# Patient Record
Sex: Female | Born: 1985 | Race: Black or African American | Hispanic: No | Marital: Single | Smoking: Never smoker
Health system: Southern US, Community
[De-identification: ages and names within clinical notes are randomized; demographics above are authoritative.]

---

## 2017-07-19 ENCOUNTER — Emergency Department (HOSPITAL_COMMUNITY)
Admission: EM | Admit: 2017-07-19 | Discharge: 2017-07-19 | Disposition: A | Discharge status: Home / Self Care | Payer: Medicaid - Out of State | Attending: Emergency Medicine | Admitting: Emergency Medicine

## 2017-07-19 ENCOUNTER — Emergency Department (HOSPITAL_COMMUNITY): Payer: Medicaid - Out of State

## 2017-07-19 ENCOUNTER — Encounter (HOSPITAL_COMMUNITY): Payer: Self-pay | Admitting: Emergency Medicine

## 2017-07-19 DIAGNOSIS — R0602 Shortness of breath: Secondary | ICD-10-CM | POA: Insufficient documentation

## 2017-07-19 DIAGNOSIS — R079 Chest pain, unspecified: Secondary | ICD-10-CM

## 2017-07-19 LAB — BASIC METABOLIC PANEL
Anion gap: 8 (ref 5–15)
BUN: 6 mg/dL (ref 6–20)
CHLORIDE: 104 mmol/L (ref 101–111)
CO2: 25 mmol/L (ref 22–32)
Calcium: 9.2 mg/dL (ref 8.9–10.3)
Creatinine, Ser: 0.78 mg/dL (ref 0.44–1.00)
GFR calc Af Amer: >60 mL/min (ref >60)
GFR calc non Af Amer: >60 mL/min (ref >60)
GLUCOSE: 94 mg/dL (ref 65–99)
POTASSIUM: 3.9 mmol/L (ref 3.5–5.1)
Sodium: 137 mmol/L (ref 135–145)

## 2017-07-19 LAB — CBC
HEMATOCRIT: 39.6 % (ref 36.0–46.0)
Hemoglobin: 13.4 g/dL (ref 12.0–15.0)
MCH: 30.7 pg (ref 26.0–34.0)
MCHC: 33.8 g/dL (ref 30.0–36.0)
MCV: 90.8 fL (ref 78.0–100.0)
Platelets: 348 10*3/uL (ref 150–400)
RBC: 4.36 MIL/uL (ref 3.87–5.11)
RDW: 14.1 % (ref 11.5–15.5)
WBC: 8 10*3/uL (ref 4.0–10.5)

## 2017-07-19 LAB — I-STAT TROPONIN, ED: Troponin i, poc: 0 ng/mL (ref 0.00–0.08)

## 2017-07-19 LAB — I-STAT BETA HCG BLOOD, ED (MC, WL, AP ONLY)

## 2017-07-19 MED ORDER — IBUPROFEN 600 MG PO TABS: 600.0000 mg | ORAL_TABLET | Freq: Four times a day (QID) | ORAL | 0 refills | Status: AC | PRN

## 2017-07-19 MED ORDER — IBUPROFEN 400 MG PO TABS
600.0000 mg | ORAL_TABLET | Freq: Once | ORAL | Status: AC
  Administered 2017-07-19: 600 mg via ORAL
  Filled 2017-07-19: qty 2

## 2017-07-19 NOTE — Discharge Instructions (Addendum)
Medications: ibuprofen  Treatment: Take ibuprofen every 6 hours as prescribed. You can use ice and heat to your chest alternating 20 min off, 20 min on.  Follow-up: Please follow up with your doctor for further evaluation and treatment. Please return to the emergency department if you develop any new or worsening symptoms.

## 2017-07-19 NOTE — ED Triage Notes (Signed)
Patient complaining of chest pain with shortness of breath x 2 days.

## 2017-07-19 NOTE — ED Provider Notes (Signed)
[facility] EMERGENCY DEPARTMENT Provider Note   CSN: [id number] Arrival date & time: 07/19/17  1003     History   Chief Complaint Chief Complaint  Patient presents with  . Chest Pain    HPI Emily Merritt is a 32 y.o. female who is previously healthy who presents with a 2 to 67-month history of intermittent left-sided chest tightness.  She has had associated shortness of breath sometimes.  She reports it lasts for couple seconds and then resolves.  She reports it comes out of nowhere.  It does seem to be worse when she moves her left arm.  The pain does not radiate.  She denies any pleuritic symptoms.  She has a new baby and has had some associated stress related that, however no specific stressors.  She has taken Tylenol at home without significant relief.  She denies any history of diagnosed anxiety.  She denies any recent long trips, surgeries, known cancer, exogenous estrogen use, new leg pain or swelling, history of blood clots.  She also denies any abdominal pain, nausea, vomiting, urinary symptoms.  HPI  History reviewed. No pertinent past medical history.  There are no active problems to display for this patient.   History reviewed. No pertinent surgical history.   OB History   None      Home Medications    Prior to Admission medications   Medication Sig Start Date End Date Taking? Authorizing Provider  acetaminophen (TYLENOL) 500 MG tablet Take 1,000 mg by mouth every 6 (six) hours as needed for mild pain, moderate pain or headache.   Yes [provider]  Prenat w/o A-FeCbGl-DSS-FA-DHA (CITRANATAL ASSURE PO) Take 2 tablets by mouth daily.   Yes [provider]  ibuprofen (ADVIL,MOTRIN) 600 MG tablet Take 1 tablet (600 mg total) by mouth every 6 (six) hours as needed. 07/19/17   Emi Holes, PA-C    Family History History reviewed. No pertinent family history.  Social History Social History   Tobacco Use  . Smoking status: Never Smoker  .  Smokeless tobacco: Never Used  Substance Use Topics  . Alcohol use: Never    Frequency: Never  . Drug use: Never     Allergies   Patient has no known allergies.   Review of Systems Review of Systems  Constitutional: Negative for chills and fever.  HENT: Negative for facial swelling and sore throat.   Respiratory: Positive for chest tightness and shortness of breath. Negative for cough.   Cardiovascular: Positive for chest pain (more tightness).  Gastrointestinal: Negative for abdominal pain, nausea and vomiting.  Genitourinary: Negative for dysuria.  Musculoskeletal: Negative for back pain.  Skin: Negative for rash and wound.  Neurological: Negative for headaches.  Psychiatric/Behavioral: The patient is not nervous/anxious.      Physical Exam Updated Vital Signs BP 103/75   Pulse (!) 56   Temp 99.2 F (37.3 C) (Oral)   Resp 13   Ht  (1.753 m)   Wt 65.8 kg (145 lb)   LMP 07/19/2017   SpO2 100%   BMI 21.41 kg/m   Physical Exam  Constitutional: She appears well-developed and well-nourished. No distress.  Patient tearful, worried about the situation  HENT:  Head: Normocephalic and atraumatic.  Mouth/Throat: Oropharynx is clear and moist. No oropharyngeal exudate.  Eyes: Pupils are equal, round, and reactive to light. Conjunctivae are normal. Right eye exhibits no discharge. Left eye exhibits no discharge. No scleral icterus.  Neck: Normal range of motion. Neck supple.  No thyromegaly present.  Cardiovascular: Normal rate, regular rhythm, normal heart sounds and intact distal pulses. Exam reveals no gallop and no friction rub.  No murmur heard. Pulmonary/Chest: Effort normal and breath sounds normal. No stridor. No respiratory distress. She has no wheezes. She has no rales. She exhibits tenderness (mild point tenderness as indicated).  Some point chest tightness reproduced with abduction of L arm    Abdominal: Soft. Bowel sounds are normal. She exhibits no  distension. There is no tenderness. There is no rebound and no guarding.  Musculoskeletal: She exhibits no edema.  Lymphadenopathy:    She has no cervical adenopathy.  Neurological: She is alert. Coordination normal.  Skin: Skin is warm and dry. No rash noted. She is not diaphoretic. No pallor.  Psychiatric: She has a normal mood and affect.  Nursing note and vitals reviewed.    ED Treatments / Results  Labs (all labs ordered are listed, but only abnormal results are displayed) Labs Reviewed  BASIC METABOLIC PANEL  CBC  I-STAT TROPONIN, ED  I-STAT BETA HCG BLOOD, ED ([facility], [facility], [facility] ONLY)    EKG EKG Interpretation  Date/Time:  Saturday Jul 19 2017 10:17:08 EDT Ventricular Rate:  64 PR Interval:    QRS Duration: 93 QT Interval:  415 QTC Calculation: 429 R Axis:   79 Text Interpretation:  Sinus rhythm Borderline Q waves in lateral leads Baseline wander in lead(s) V6 Confirmed by Donnetta Hutching ([id number]) on 07/19/2017 11:19:45 AM   Radiology Dg Chest 2 View  Result Date: 07/19/2017 CLINICAL DATA:  Chest pain. EXAM: CHEST - 2 VIEW COMPARISON:  None. FINDINGS: The heart size and mediastinal contours are within normal limits. There is no evidence of pulmonary edema, consolidation, pneumothorax, nodule or pleural fluid. The visualized skeletal structures are unremarkable. IMPRESSION: No active cardiopulmonary disease. Electronically Signed   By: Irish Lack M.D.   On: 07/19/2017 10:51    Procedures Procedures (including critical care time)  Medications Ordered in ED Medications  ibuprofen (ADVIL,MOTRIN) tablet 600 mg (600 mg Oral Given 07/19/17 1105)     Initial Impression / Assessment and Plan / ED Course  I have reviewed the triage vital signs and the nursing notes.  Pertinent labs & imaging results that were available during my care of the patient were reviewed by me and considered in my medical decision making (see chart for details).  Clinical Course as of Jul 19 1716  Sat  Jul 19, 2017  1231 On reassessment after ibuprofen, patient has not had any more episodes of chest pain.  Considering pain has not been constant, will repeat troponin, however suspicion for ACS is low. HEART score 1.   [AL]    Clinical Course User Index [AL] Emi Holes, PA-C    Patient with atypical chest pain, suspect related to musculoskeletal chest wall pain versus anxiety.  Reproducible with movement and palpation.  EKG shows NSR, borderline Q waves.  Chest x-ray negative.  CBC, BMP, troponin remarkable. Patient did not want to be stuck again to repeat troponin, however low suspicion for ACS or PE.  PERC negative.  HEART score 1.  PCP for further management.  Strict return precautions given.  Patient understands and agrees with plan.  Patient is feeling much better after ibuprofen in the ED.  Patient will follow-up with patient vitals stable throughout ED course and discharged in satisfactory condition.  Final Clinical Impressions(s) / ED Diagnoses   Final diagnoses:  Nonspecific chest pain    ED Discharge Orders  Ordered    ibuprofen (ADVIL,MOTRIN) 600 MG tablet  Every 6 hours PRN     07/19/17 [address] PA-C 07/19/17 1719    Donnetta Hutching, MD 07/20/17 [phone]

## 2017-07-19 NOTE — ED Notes (Signed)
Call to confirm that trop should be drawn  Pt pain is mid sternal without radiation

## 2017-07-19 NOTE — ED Notes (Signed)
Pt demanding IV DC  Refuses further labs

## 2019-01-16 ENCOUNTER — Emergency Department (HOSPITAL_COMMUNITY)
Admission: EM | Admit: 2019-01-16 | Discharge: 2019-01-16 | Disposition: A | Discharge status: Home / Self Care | Payer: Medicaid - Out of State | Attending: Emergency Medicine | Admitting: Emergency Medicine

## 2019-01-16 ENCOUNTER — Other Ambulatory Visit: Payer: Self-pay

## 2019-01-16 ENCOUNTER — Encounter (HOSPITAL_COMMUNITY): Payer: Self-pay | Admitting: *Deleted

## 2019-01-16 DIAGNOSIS — L299 Pruritus, unspecified: Secondary | ICD-10-CM | POA: Insufficient documentation

## 2019-01-16 DIAGNOSIS — R21 Rash and other nonspecific skin eruption: Secondary | ICD-10-CM | POA: Insufficient documentation

## 2019-01-16 MED ORDER — ONDANSETRON HCL 4 MG PO TABS
4.0000 mg | ORAL_TABLET | Freq: Once | ORAL | Status: AC
  Administered 2019-01-16: 19:00:00 4 mg via ORAL
  Filled 2019-01-16: qty 1

## 2019-01-16 MED ORDER — FEXOFENADINE HCL 60 MG PO TABS: 60.0000 mg | ORAL_TABLET | Freq: Two times a day (BID) | ORAL | 0 refills | Status: AC

## 2019-01-16 MED ORDER — CEPHALEXIN 500 MG PO CAPS: 500.0000 mg | ORAL_CAPSULE | Freq: Four times a day (QID) | ORAL | 0 refills | Status: AC

## 2019-01-16 MED ORDER — DEXAMETHASONE 4 MG PO TABS: 4.0000 mg | ORAL_TABLET | Freq: Two times a day (BID) | ORAL | 0 refills | Status: AC

## 2019-01-16 MED ORDER — PREDNISONE 20 MG PO TABS
40.0000 mg | ORAL_TABLET | Freq: Once | ORAL | Status: AC
  Administered 2019-01-16: 40 mg via ORAL
  Filled 2019-01-16: qty 2

## 2019-01-16 MED ORDER — CEPHALEXIN 500 MG PO CAPS
500.0000 mg | ORAL_CAPSULE | Freq: Once | ORAL | Status: AC
  Administered 2019-01-16: 500 mg via ORAL
  Filled 2019-01-16: qty 1

## 2019-01-16 NOTE — ED Triage Notes (Signed)
Pt c/o red, itchy rash to face underneath bilateral eyes that is now progressing down to her chest x couple of days. Pt reports taking Benadryl without any relief. Pt also c/o of her eyes burning.

## 2019-01-16 NOTE — ED Provider Notes (Signed)
[facility] EMERGENCY DEPARTMENT Provider Note   CSN: [id number] Arrival date & time: 01/16/19  1741     History   Chief Complaint Chief Complaint  Patient presents with  . Rash    HPI Emily Merritt is a 33 y.o. female.     Patient is a 33 year old female who presents to the emergency department with a complaint of rash to her face.  The patient states this is been going on for about 2 days.  The rash to her face started under her eyes.  In the last 24 hours she has noted a couple spots on her neck and one on her chest.  The patient denies any known allergies.  She has not been around any grass or weeds.  She denies using any make up.  She is not used any new eye make-up.  She has no food allergies.  She has not had any new medications.  Patient states that time she has some burning of her eyes.  She has tried Benadryl, but states this is not improving.  She does acknowledge that she has noted some mild swelling of her eyes with minimal material in the corners of her eye.  She presents now for assistance with this issue.  The history is provided by the patient.  Rash Associated symptoms: no abdominal pain, no diarrhea, no myalgias, no nausea, no shortness of breath, not vomiting and not wheezing     History reviewed. No pertinent past medical history.  There are no active problems to display for this patient.   History reviewed. No pertinent surgical history.   OB History   No obstetric history on file.      Home Medications    Prior to Admission medications   Medication Sig Start Date End Date Taking? Authorizing Provider  acetaminophen (TYLENOL) 500 MG tablet Take 1,000 mg by mouth every 6 (six) hours as needed for mild pain, moderate pain or headache.    [provider]  ibuprofen (ADVIL,MOTRIN) 600 MG tablet Take 1 tablet (600 mg total) by mouth every 6 (six) hours as needed. 07/19/17   Law, Bea Graff, PA-C  Prenat w/o A-FeCbGl-DSS-FA-DHA (CITRANATAL ASSURE  PO) Take 2 tablets by mouth daily.    [provider]    Family History No family history on file.  Social History Social History   Tobacco Use  . Smoking status: Never Smoker  . Smokeless tobacco: Never Used  Substance Use Topics  . Alcohol use: Never    Frequency: Never  . Drug use: Never     Allergies   Patient has no known allergies.   Review of Systems Review of Systems  Constitutional: Negative for activity change and appetite change.  HENT: Negative for congestion, ear discharge, ear pain, facial swelling, nosebleeds, rhinorrhea, sneezing and tinnitus.   Eyes: Positive for itching. Negative for photophobia, pain and discharge.  Respiratory: Negative for cough, choking, shortness of breath and wheezing.   Cardiovascular: Negative for chest pain, palpitations and leg swelling.  Gastrointestinal: Negative for abdominal pain, blood in stool, constipation, diarrhea, nausea and vomiting.  Genitourinary: Negative for difficulty urinating, dysuria, flank pain, frequency and hematuria.  Musculoskeletal: Negative for back pain, gait problem, myalgias and neck pain.  Skin: Positive for rash. Negative for color change and wound.  Neurological: Negative for dizziness, seizures, syncope, facial asymmetry, speech difficulty, weakness and numbness.  Hematological: Negative for adenopathy. Does not bruise/bleed easily.  Psychiatric/Behavioral: Negative for agitation, confusion, hallucinations, self-injury and suicidal ideas. The  patient is not nervous/anxious.      Physical Exam Updated Vital Signs BP (!) 121/91 (BP Location: Right Arm)   Pulse 94   Temp 98.8 F (37.1 C) (Oral)   Resp 18   Ht 5\' 10"  (1.778 m)   Wt 66.7 kg   LMP 12/31/2018   SpO2 97%   BMI 21.09 kg/m   Physical Exam Vitals signs and nursing note reviewed.  Constitutional:      Appearance: She is well-developed. She is not toxic-appearing.  HENT:     Head: Normocephalic.     Comments: There is  a raised area beside the right eyebrow that is not red.  There is no drainage or pustule present.  There is also a raised area at the center of the forehead, that is not red or draining.  No pustule noted at this area.    Right Ear: Tympanic membrane and external ear normal.     Left Ear: Tympanic membrane and external ear normal.  Eyes:     General: Lids are normal. No scleral icterus.    Extraocular Movements: Extraocular movements intact.     Conjunctiva/sclera: Conjunctivae normal.     Pupils: Pupils are equal, round, and reactive to light.     Comments: There is no swelling swelling of the upper or lower lids.  The bulbar conjunctiva is clear.  There is a reddened area to the lateral corner of the right eye, and a reddened area to the lateral corner of the left eye.  There is also a small reddened area just under the left eye.  Neck:     Musculoskeletal: Normal range of motion and neck supple.     Vascular: No carotid bruit.     Comments: There are 2 small red raised areas in the center of the neck. Cardiovascular:     Rate and Rhythm: Normal rate and regular rhythm.     Pulses: Normal pulses.     Heart sounds: Normal heart sounds.  Pulmonary:     Effort: No respiratory distress.     Breath sounds: Normal breath sounds.  Abdominal:     General: Bowel sounds are normal.     Palpations: Abdomen is soft.     Tenderness: There is no abdominal tenderness. There is no guarding.  Musculoskeletal: Normal range of motion.  Lymphadenopathy:     Head:     Right side of head: No submandibular adenopathy.     Left side of head: No submandibular adenopathy.     Cervical: No cervical adenopathy.  Skin:    General: Skin is warm and dry.     Findings: Rash present.  Neurological:     Mental Status: She is alert and oriented to person, place, and time.     Cranial Nerves: No cranial nerve deficit.     Sensory: No sensory deficit.  Psychiatric:        Speech: Speech normal.      ED  Treatments / Results  Labs (all labs ordered are listed, but only abnormal results are displayed) Labs Reviewed - No data to display  EKG None  Radiology No results found.  Procedures Procedures (including critical care time)  Medications Ordered in ED Medications  cephALEXin (KEFLEX) capsule 500 mg (has no administration in time range)  predniSONE (DELTASONE) tablet 40 mg (has no administration in time range)  ondansetron (ZOFRAN) tablet 4 mg (has no administration in time range)     Initial Impression / Assessment and Plan /  ED Course  I have reviewed the triage vital signs and the nursing notes.  Pertinent labs & imaging results that were available during my care of the patient were reviewed by me and considered in my medical decision making (see chart for details).          Final Clinical Impressions(s) / ED Diagnoses MDM  Vital signs reviewed.  Pulse oximetry is 97% on room air.  Within normal limits by my interpretation.  The patient presents with area of redness to the lateral portion of the right and left face just under the eye.  Is also a small red slightly raised area just under the center of the left eye.  There are 2 raised areas near the eyebrow and forehead, as well as a red raised area on the neck and one on the chest.  The patient denies any known allergies.  She has not been in any grass or weeds, she is not changed soaps, she does not use any facial make-up.  She is not on any medications.  She says that she has itching in these raised areas, and no drainage appreciated.  Patient will be asked to use Keflex and Decadron.  Patient will be referred to dermatology for additional evaluation and management.  Patient is asked to use warm compresses to the face.   Final diagnoses:  Rash and nonspecific skin eruption    ED Discharge Orders         Ordered    fexofenadine (ALLEGRA) 60 MG tablet  2 times daily     01/16/19 1849    cephALEXin (KEFLEX) 500  MG capsule  4 times daily     01/16/19 1849    dexamethasone (DECADRON) 4 MG tablet  2 times daily with meals     01/16/19 1849           Ivery Quale, PA-C 01/16/19 1852    Terald Sleeper, MD 01/17/19 1027

## 2019-01-16 NOTE — Discharge Instructions (Addendum)
Please apply warm compresses to your face 2 or 3 times daily.  Please use Allegra 2 times daily for itching.  Use Decadron please take this medication with food.  Use Keflex with breakfast, lunch, dinner, and at bedtime for possible infection.  Please call Ms. Scheffield with [facility] and set up an appointment if not improving.

## 2019-01-16 NOTE — ED Notes (Signed)
Reports a 2 days rash to cheeks and neck improved with benadryl   She states her eyes are also burning   Denies change of soap, does not wear makeup, and denies any food allergy that she is aware of

## 2020-03-14 ENCOUNTER — Other Ambulatory Visit: Payer: Self-pay

## 2020-03-14 ENCOUNTER — Emergency Department (HOSPITAL_COMMUNITY)
Admission: EM | Admit: 2020-03-14 | Discharge: 2020-03-14 | Disposition: A | Discharge status: Home / Self Care | Payer: Medicaid Other | Attending: Emergency Medicine | Admitting: Emergency Medicine

## 2020-03-14 ENCOUNTER — Encounter (HOSPITAL_COMMUNITY): Payer: Self-pay | Admitting: Emergency Medicine

## 2020-03-14 DIAGNOSIS — R197 Diarrhea, unspecified: Secondary | ICD-10-CM | POA: Diagnosis not present

## 2020-03-14 DIAGNOSIS — R109 Unspecified abdominal pain: Secondary | ICD-10-CM | POA: Diagnosis not present

## 2020-03-14 DIAGNOSIS — Z5321 Procedure and treatment not carried out due to patient leaving prior to being seen by health care provider: Secondary | ICD-10-CM | POA: Insufficient documentation

## 2020-03-14 DIAGNOSIS — M791 Myalgia, unspecified site: Secondary | ICD-10-CM | POA: Insufficient documentation

## 2020-03-14 DIAGNOSIS — R112 Nausea with vomiting, unspecified: Secondary | ICD-10-CM | POA: Insufficient documentation

## 2020-03-14 NOTE — ED Notes (Signed)
Pt refuses to be covid swabbed and states " I don't have any Covid symptoms"  Pt became upset when advised to go back to the waiting room as there are no available rooms. Pt asked if I was refusing to treat her because she refused a covid swab. I stated that triage was complete and I had no further questions for her. I assured her she would be seen and treated. Pt asked for my name and states she may need to speak to an Production designer, theatre/television/film.

## 2020-03-14 NOTE — ED Triage Notes (Signed)
Pt c/o Abdominal pain with N/V/D and generalized body aches since yesterday.

## 2020-08-29 ENCOUNTER — Emergency Department (HOSPITAL_COMMUNITY): Payer: Medicaid Other

## 2020-08-29 ENCOUNTER — Encounter (HOSPITAL_COMMUNITY): Payer: Self-pay | Admitting: Emergency Medicine

## 2020-08-29 ENCOUNTER — Emergency Department (HOSPITAL_COMMUNITY)
Admission: EM | Admit: 2020-08-29 | Discharge: 2020-08-29 | Disposition: A | Discharge status: Home / Self Care | Payer: Medicaid Other | Attending: Emergency Medicine | Admitting: Emergency Medicine

## 2020-08-29 ENCOUNTER — Other Ambulatory Visit: Payer: Self-pay

## 2020-08-29 DIAGNOSIS — O039 Complete or unspecified spontaneous abortion without complication: Secondary | ICD-10-CM | POA: Insufficient documentation

## 2020-08-29 DIAGNOSIS — Z3A00 Weeks of gestation of pregnancy not specified: Secondary | ICD-10-CM | POA: Insufficient documentation

## 2020-08-29 DIAGNOSIS — F606 Avoidant personality disorder: Secondary | ICD-10-CM | POA: Insufficient documentation

## 2020-08-29 DIAGNOSIS — N939 Abnormal uterine and vaginal bleeding, unspecified: Secondary | ICD-10-CM

## 2020-08-29 DIAGNOSIS — O209 Hemorrhage in early pregnancy, unspecified: Secondary | ICD-10-CM | POA: Diagnosis present

## 2020-08-29 LAB — CBC WITH DIFFERENTIAL/PLATELET
Abs Immature Granulocytes: 0.05 10*3/uL (ref 0.00–0.07)
Basophils Absolute: 0.1 10*3/uL (ref 0.0–0.1)
Basophils Relative: 0 %
Eosinophils Absolute: 0 10*3/uL (ref 0.0–0.5)
Eosinophils Relative: 0 %
HCT: 38.4 % (ref 36.0–46.0)
Hemoglobin: 12.7 g/dL (ref 12.0–15.0)
Immature Granulocytes: 0 %
Lymphocytes Relative: 18 %
Lymphs Abs: 2.6 10*3/uL (ref 0.7–4.0)
MCH: 31.1 pg (ref 26.0–34.0)
MCHC: 33.1 g/dL (ref 30.0–36.0)
MCV: 93.9 fL (ref 80.0–100.0)
Monocytes Absolute: 0.8 10*3/uL (ref 0.1–1.0)
Monocytes Relative: 6 %
Neutro Abs: 10.6 10*3/uL — ABNORMAL HIGH (ref 1.7–7.7)
Neutrophils Relative %: 76 %
Platelets: 402 10*3/uL — ABNORMAL HIGH (ref 150–400)
RBC: 4.09 MIL/uL (ref 3.87–5.11)
RDW: 14.4 % (ref 11.5–15.5)
WBC: 14 10*3/uL — ABNORMAL HIGH (ref 4.0–10.5)
nRBC: 0 % (ref 0.0–0.2)

## 2020-08-29 LAB — BASIC METABOLIC PANEL
Anion gap: 8 (ref 5–15)
BUN: 6 mg/dL (ref 6–20)
CO2: 20 mmol/L — ABNORMAL LOW (ref 22–32)
Calcium: 9.3 mg/dL (ref 8.9–10.3)
Chloride: 107 mmol/L (ref 98–111)
Creatinine, Ser: 0.61 mg/dL (ref 0.44–1.00)
GFR, Estimated: >60 mL/min (ref >60)
Glucose, Bld: 105 mg/dL — ABNORMAL HIGH (ref 70–99)
Potassium: 3.9 mmol/L (ref 3.5–5.1)
Sodium: 135 mmol/L (ref 135–145)

## 2020-08-29 LAB — HCG, QUANTITATIVE, PREGNANCY: hCG, Beta Chain, Quant, S: 3057 m[IU]/mL — ABNORMAL HIGH (ref <5)

## 2020-08-29 LAB — ABO/RH: ABO/RH(D): O POS

## 2020-08-29 MED ORDER — LACTATED RINGERS IV BOLUS
1000.0000 mL | Freq: Once | INTRAVENOUS | Status: AC
  Administered 2020-08-29: 1000 mL via INTRAVENOUS

## 2020-08-29 MED ORDER — FENTANYL CITRATE (PF) 100 MCG/2ML IJ SOLN
50.0000 ug | Freq: Once | INTRAMUSCULAR | Status: AC
  Administered 2020-08-29: 50 ug via INTRAVENOUS
  Filled 2020-08-29: qty 2

## 2020-08-29 MED ORDER — ONDANSETRON HCL 4 MG/2ML IJ SOLN
4.0000 mg | Freq: Once | INTRAMUSCULAR | Status: AC
  Administered 2020-08-29: 4 mg via INTRAVENOUS
  Filled 2020-08-29: qty 2

## 2020-08-29 MED ORDER — OXYCODONE-ACETAMINOPHEN 5-325 MG PO TABS: 1.0000 | ORAL_TABLET | Freq: Four times a day (QID) | ORAL | 0 refills | Status: AC | PRN

## 2020-08-29 MED ORDER — KETOROLAC TROMETHAMINE 30 MG/ML IJ SOLN
30.0000 mg | Freq: Once | INTRAMUSCULAR | Status: AC
  Administered 2020-08-29: 30 mg via INTRAVENOUS
  Filled 2020-08-29: qty 1

## 2020-08-29 NOTE — ED Notes (Signed)
Pt stating to this nurse, I do not feel good vibes from you,  I want you to be kind to me you have not been nice to me.  This nurse exited room to request additional nurse who she feels better vibes with.  Provider in room and witnessed conversation.

## 2020-08-29 NOTE — ED Notes (Signed)
Assisted pt with cleaning up gave her mesh panties and cleaner for her vaginal bleeding.

## 2020-08-29 NOTE — ED Notes (Signed)
This nurse and patient have reconnected.  Pt assisted with cleansing and vaginal bleeding.  Provider asked to come to bedside for uterine discharge.

## 2020-08-29 NOTE — Discharge Instructions (Addendum)
You were seen in the emergency department for your vaginal bleeding.  Unfortunately you have experienced a miscarriage here in the emergency department.  This was confirmed with an ultrasound, which confirmed that there is no longer a pregnancy in your uterus.  This is consistent with the amount of tissue that you passed in the emergency department.  It will be extremely important that you call your OB/GYN for follow-up within the next 72 hours for recheck of your abdominal pain and your pregnancy hormone test, to confirm that your pregnancy hormone is decreasing.    You have been prescribed a pain medication called Percocet, which is combination of a narcotic medication called oxycodone and Tylenol.  May take this as well as ibuprofen as needed for your pain at home.  Additionally recommend heating pad to your abdomen and lots of rest for the next several days.  It will be normal for you to continue to have vaginal bleeding for the next several days.  Please discuss the quantity of your bleeding with your OB/GYN and follow-up this week.  The tissue from your pregnancy has been sent to the lab for evaluation.  You may discuss the results with your OB/GYN.  Please return to the emergency department immediately if you develop any significant increase in your vaginal bleeding, fevers or chills, nausea or vomiting that does not stop, or any other new severe symptoms.  I am very sorry for your loss. While it will be normal for you to experience many emotions in the coming days, please do your best to remember that this was not your fault. You did nothing wrong to cause your miscarriage. Additionally, please remember it is normal to feel quite sad, but if you begin to have thoughts about harming yourself, please discuss these thoughts as soon as possible with your OBGYN or a provider in the Emergency Department, so they can offer you the support you need.

## 2020-08-29 NOTE — ED Notes (Signed)
Upon walking in room pt is naked from the waste below on phone.  This nurse asked pt what she was here for today.  Pt continue to be on the phone.  Pt given a sheet to cover up her bottom area for privacy.  I asked again what she was here for and she got off the phone. Pt requesting a provider.  Stating she has been here for 25 minutes and is an emergency and needs something for the bleeding and know what is going on NOW.   Provider made aware.  Bleeding is moderate at this time.

## 2020-08-29 NOTE — ED Triage Notes (Signed)
Pt c/o vaginal bleeding with clots that began 2 hours ago.

## 2020-08-29 NOTE — ED Provider Notes (Signed)
[facility] Provider [facility]   CSN: [id number] Arrival date & time: 08/29/20  1126     History Chief Complaint  Patient presents with   Vaginal Bleeding    Emily Merritt is a 35 y.o. female who presents with concern for vaginal bleeding that started this morning with associated lower abdominal cramping that has progressively worsened throughout her emergency department stay.  Bleeding started approximately 2 hours prior to arrival to the emergency department.  Patient is 10 months postpartum, last period was April 2022; she felt that her lack of constriction since that time was likely related to her stress level.  She has not taken any pregnancy tests at home.  She denies any history of heavy menstrual cycles in the past, denies history of passing blood clots.  Denies any lightheadedness, shortness of breath, palpitations, nausea, vomiting, or urinary symptoms.  She is sexually active with a female partner.  I personally reviewed this patient's medical records.  She does not carry medical diagnoses and is not on any medications every day.  HPI     History reviewed. No pertinent past medical history.  There are no problems to display for this patient.   History reviewed. No pertinent surgical history.   OB History   No obstetric history on file.     History reviewed. No pertinent family history.  Social History   Tobacco Use   Smoking status: Never   Smokeless tobacco: Never  Vaping Use   Vaping Use: Never used  Substance Use Topics   Alcohol use: Never   Drug use: Never    Home Medications Prior to Admission medications   Medication Sig Start Date End Date Taking? Authorizing Provider  oxyCODONE-acetaminophen (PERCOCET/ROXICET) 5-325 MG tablet Take 1 tablet by mouth every 6 (six) hours as needed for severe pain. 08/29/20  Yes Pancho Rushing R, PA-C  cephALEXin (KEFLEX) 500 MG capsule Take 1 capsule (500 mg total) by mouth 4 (four) times daily.  01/16/19   Ivery QualeBryant, Hobson, PA-C  dexamethasone (DECADRON) 4 MG tablet Take 1 tablet (4 mg total) by mouth 2 (two) times daily with a meal. 01/16/19   Ivery QualeBryant, Hobson, PA-C  fexofenadine (ALLEGRA) 60 MG tablet Take 1 tablet (60 mg total) by mouth 2 (two) times daily. 01/16/19   Ivery QualeBryant, Hobson, PA-C  ibuprofen (ADVIL,MOTRIN) 600 MG tablet Take 1 tablet (600 mg total) by mouth every 6 (six) hours as needed. 07/19/17   Law, Waylan BogaAlexandra M, PA-C  Prenat w/o A-FeCbGl-DSS-FA-DHA (CITRANATAL ASSURE PO) Take 2 tablets by mouth daily.    [provider]    Allergies    Patient has no known allergies.  Review of Systems   Review of Systems  Constitutional: Negative.   HENT: Negative.    Respiratory: Negative.    Cardiovascular: Negative.   Gastrointestinal:  Positive for anal bleeding. Negative for blood in stool, constipation, diarrhea, nausea and vomiting.  Genitourinary:  Positive for menstrual problem, pelvic pain, vaginal bleeding and vaginal pain. Negative for decreased urine volume, difficulty urinating, dyspareunia, dysuria, enuresis, flank pain, frequency, genital sores, hematuria and urgency.  Musculoskeletal: Negative.   Skin: Negative.   Neurological: Negative.   Hematological: Negative.   Psychiatric/Behavioral:  The patient is nervous/anxious.    Physical Exam Updated Vital Signs BP 107/71   Pulse 68   Temp 99.5 F (37.5 C) (Oral)   Resp 11   Ht 5\' 9"  (1.753 m)   Wt 68 kg   LMP 08/28/2020 (Exact Date)   SpO2  100%   BMI 22.15 kg/m   Physical Exam Vitals and nursing note reviewed. Exam conducted with a chaperone present.  Constitutional:      General: She is in acute distress.     Appearance: She is not toxic-appearing.     Comments: Secondary to abdominal pain and extent of vaginal bleeding  HENT:     Head: Normocephalic and atraumatic.     Nose: Nose normal.     Mouth/Throat:     Mouth: Mucous membranes are moist.     Pharynx: Oropharynx is clear. Uvula midline.  No oropharyngeal exudate or posterior oropharyngeal erythema.     Tonsils: No tonsillar exudate.  Eyes:     General: Lids are normal. Vision grossly intact.        Right eye: No discharge.        Left eye: No discharge.     Extraocular Movements: Extraocular movements intact.     Conjunctiva/sclera: Conjunctivae normal.     Pupils: Pupils are equal, round, and reactive to light.  Neck:     Trachea: Trachea and phonation normal.  Cardiovascular:     Rate and Rhythm: Normal rate and regular rhythm.     Pulses: Normal pulses.     Heart sounds: Normal heart sounds. No murmur heard. Pulmonary:     Effort: Pulmonary effort is normal. No tachypnea, bradypnea, accessory muscle usage, prolonged expiration or respiratory distress.     Breath sounds: Normal breath sounds. No wheezing or rales.  Chest:     Chest wall: No mass, lacerations, deformity, swelling, tenderness, crepitus or edema.  Abdominal:     General: Bowel sounds are normal. There is no distension.     Palpations: Abdomen is soft.     Tenderness: There is abdominal tenderness in the right lower quadrant, suprapubic area and left lower quadrant. There is no right CVA tenderness, left CVA tenderness, guarding or rebound.  Genitourinary:    General: Normal vulva.     Comments: PainLarge amount of blood on the chuck pad on the patient's bed, a softball sized amount of blood clot appearing tissue on the check, with more clot coming from the patient's vagina during exam.  Internal GU exam deferred by the patient following reassuring ultrasound. External GU exam with significant of blood coming from the vagina, however vulva is normal. Musculoskeletal:        General: No deformity.     Cervical back: Normal range of motion and neck supple. No edema, rigidity or crepitus. No pain with movement.     Right lower leg: No edema.     Left lower leg: No edema.  Lymphadenopathy:     Cervical: No cervical adenopathy.     Lower Body: No right  inguinal adenopathy. No left inguinal adenopathy.  Skin:    General: Skin is warm and dry.     Capillary Refill: Capillary refill takes less than 2 seconds.  Neurological:     Mental Status: She is alert. Mental status is at baseline.  Psychiatric:        Mood and Affect: Mood is anxious. Affect is tearful.    ED Results / Procedures / Treatments   Labs (all labs ordered are listed, but only abnormal results are displayed) Labs Reviewed  HCG, QUANTITATIVE, PREGNANCY - Abnormal; Notable for the following components:      Result Value   hCG, Beta Chain, Quant, S 3,057 (*)    All other components within normal limits  BASIC METABOLIC PANEL -  Abnormal; Notable for the following components:   CO2 20 (*)    Glucose, Bld 105 (*)    All other components within normal limits  CBC WITH DIFFERENTIAL/PLATELET - Abnormal; Notable for the following components:   WBC 14.0 (*)    Platelets 402 (*)    Neutro Abs 10.6 (*)    All other components within normal limits  URINE CULTURE  URINALYSIS, ROUTINE W REFLEX MICROSCOPIC  ABO/RH  SURGICAL PATHOLOGY    EKG None  Radiology US OB LESS THAN 14 WEEKS WITH OB TRANSVAGINAL  Result Date: 08/29/2020 CLINICAL DATA:  35 year old pregnant female with vaginal bleeding. LMP: 06/17/2020 corresponding to an estimated gestational age of [redacted] weeks, 3 days. EXAM: OBSTETRIC <14 WK [place] AND TRANSVAGINAL OB US TECHNIQUE: Both transabdominal and transvaginal ultrasound examinations were performed for complete evaluation of the gestation as well as the maternal uterus, adnexal regions, and pelvic cul-de-sac. Transvaginal technique was performed to assess early pregnancy. COMPARISON:  None. FINDINGS: The uterus is anteverted and measures 9.4 x 5.2 x 6.9 cm for a volume of 178 cc. The uterus demonstrates a heterogeneous echotexture with findings suggestive of adenomyosis. The endometrium is suboptimally visualized. The upper endometrium appears to split and may have a  bicornuate or an arcuate morphology. MRI may provide better evaluation. No intrauterine pregnancy identified. There is a 6 x 7 x 5 mm echogenic lesion in the right ovary which may represent a small fatty lesion with a focus of calcification, possibly a teratoma. There is a 1.6 cm corpus luteum cyst in the left ovary. No free fluid within the pelvis. IMPRESSION: 1. No intrauterine pregnancy identified and no suspicious adnexal masses noted. Findings consistent with pregnancy of unknown location and differential diagnosis includes: Recent spontaneous miscarriage, an early IUP, or an occult ectopic pregnancy. Clinical correlation and follow-up with serial HCG levels and repeat ultrasound, as clinically indicated, recommended. 2. Small echogenic lesion in the right ovary may represent a teratoma. Electronically Signed   By: Elgie Collard M.D.   On: 08/29/2020 16:13    Procedures Procedures   Medications Ordered in ED Medications  fentaNYL (SUBLIMAZE) injection 50 mcg (50 mcg Intravenous Given 08/29/20 1311)  ondansetron (ZOFRAN) injection 4 mg (4 mg Intravenous Given 08/29/20 1311)  lactated ringers bolus 1,000 mL (0 mLs Intravenous Stopped 08/29/20 1632)  fentaNYL (SUBLIMAZE) injection 50 mcg (50 mcg Intravenous Given 08/29/20 1511)  ketorolac (TORADOL) 30 MG/ML injection 30 mg (30 mg Intravenous Given 08/29/20 1732)    ED Course  I have reviewed the triage vital signs and the nursing notes.  Pertinent labs & imaging results that were available during my care of the patient were reviewed by me and considered in my medical decision making (see chart for details).     MDM Rules/Calculators/A&P                         35 year old female who presents with concern for extensive vaginal bleeding and lower abdominal cramping which she describes as contractions for the last approximately 4 hours.  LMP April 2022.  Differential diagnosis for this patient symptoms includes but is not limited to ectopic  pregnancy, subchorionic hemorrhage, completed abortion/incomplete abortion/inevitable/septic abortion/threatened abortion, trophoblastic disease, heterotopic pregnancy, molar pregnancy.  Patient is tachycardic on intake, vital signs otherwise normal.  At the time of my initial exam, patient is very tearful, anxious.  She is squatting in the bed over the chuck pads, with a softball sized amount of blood  clot and tissue on the pad beneath her.  Cardiopulmonary exam is normal, abdominal exam with abdomen that is soft, nondistended, and tender in the lower quadrants bilaterally as well as in the suprapubic area.  CBC with leukocytosis of 14,000, hemoglobin is normal, 12.7.  BMP unremarkable, hCG elevated to 3057, suggesting pregnancy between 4 and 6 weeks.  Throughout her stay in the emergency department, unfortunately patient did pass 3 large clumps of tissue, the largest which was approximately the size of a softball and was in appearance concerning for passing of the gestational sac.  This was sent as surgical pathology for evaluation of intrauterine contents.  Ultrasound OB was obtained, which revealed no intrauterine pregnancy but also did not reveal any findings on either ovary which were concerning for ectopic.  There is a small echogenic lesion on the right ovary concerning for possible teratoma as well as a corpus luteum on the left ovary.  Overall findings most consistent with completed miscarriage, as confirmed by physical exam and ultrasound in the emergency department today.  Extensive discussion with the patient regarding the findings of her work-up today, as well as the importance of OB/GYN follow-up within the next 48 to 72 hours for recheck of her hCG levels and physical exam.  Emily Merritt voiced understanding of her medical evaluation and treatment plan.  Each of her questions was answered to her expressed satisfaction.  Strict return precautions were given.  Patient is hemodynamically stable  and appropriate for discharge.  This chart was dictated using voice recognition software, Dragon. Despite the best efforts of this provider to proofread and correct errors, errors may still occur which can change documentation meaning.  Final Clinical Impression(s) / ED Diagnoses Final diagnoses:  Vaginal bleeding  Complete miscarriage    Rx / DC Orders ED Discharge Orders          Ordered    oxyCODONE-acetaminophen (PERCOCET/ROXICET) 5-325 MG tablet  Every 6 hours PRN        08/29/20 1716             Julez Huseby, Eugene Gavia, PA-C 08/29/20 1746    Blane Ohara, MD 08/31/20 1544

## 2020-08-31 LAB — SURGICAL PATHOLOGY

## 2021-11-10 IMAGING — US US OB < 14 WEEKS - US OB TV
1 series · 13 of 28 positions shown · non-contrast
Comparison: None.

CLINICAL DATA: 34-year-old pregnant female with vaginal bleeding.
LMP: 06/17/2020 corresponding to an estimated gestational age of 10
weeks, 3 days.

EXAM:
OBSTETRIC <14 WK US AND TRANSVAGINAL OB US
TECHNIQUE: Both transabdominal and transvaginal ultrasound examinations were
performed for complete evaluation of the gestation as well as the
maternal uterus, adnexal regions, and pelvic cul-de-sac.
Transvaginal technique was performed to assess early pregnancy.

[Series 1: us ob less than 14 weeks with ob transvaginal · 13 of 117 slices shown]
[im 5/117]
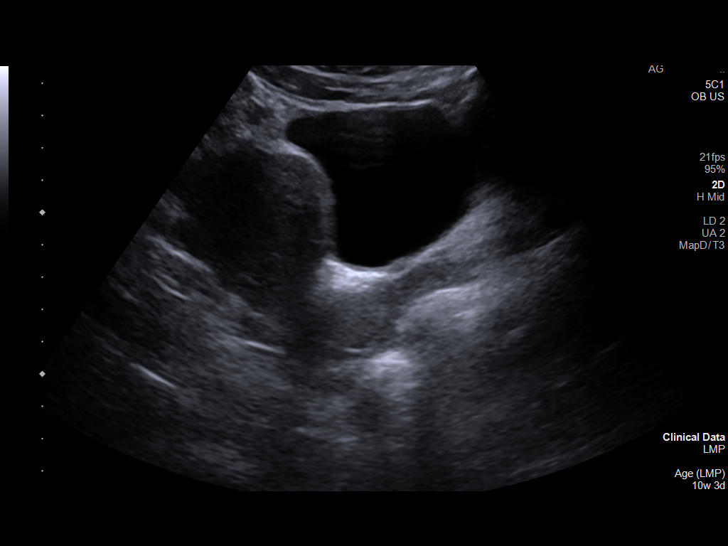
[im 13/117]
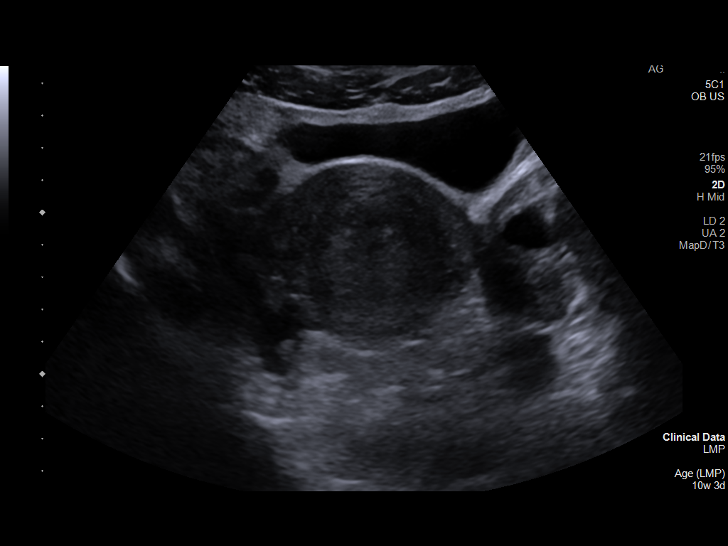
[im 22/117]
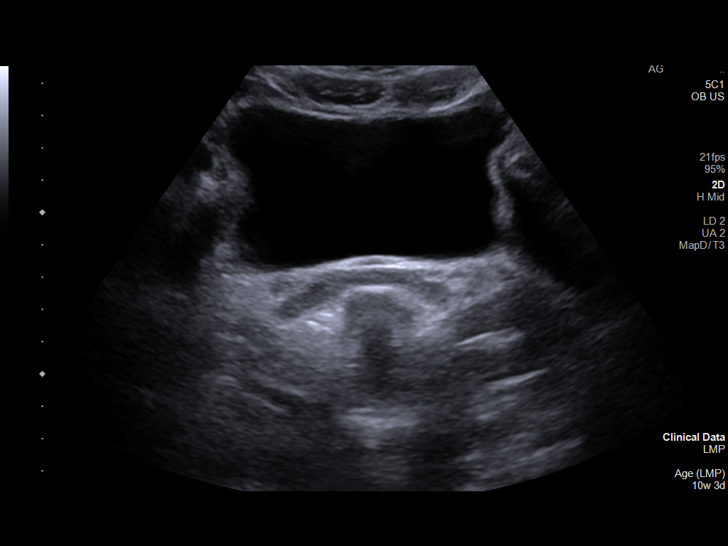
[im 31/117]
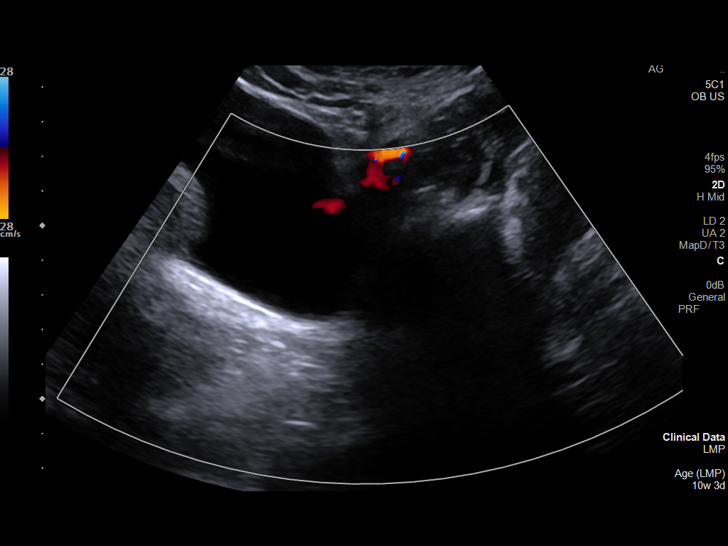
[im 39/117]
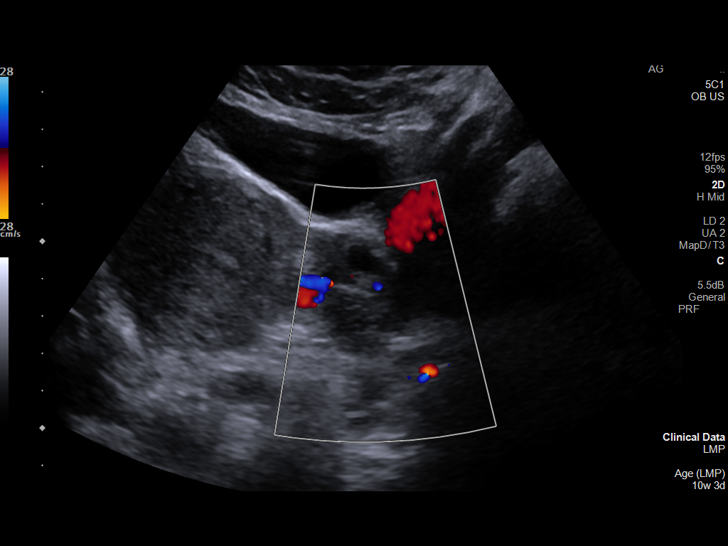
[im 48/117]
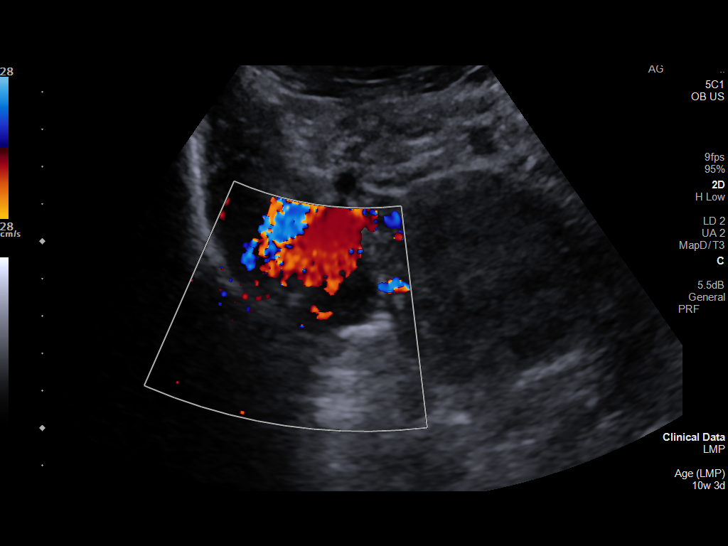
[im 61/117]
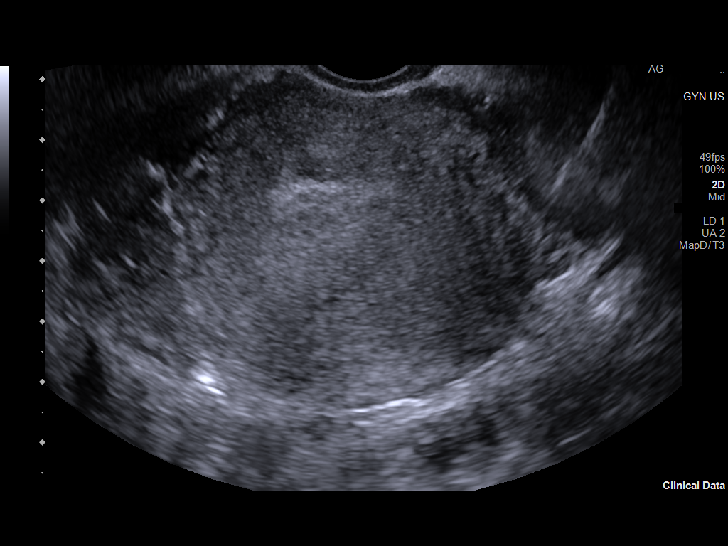
[im 69/117]
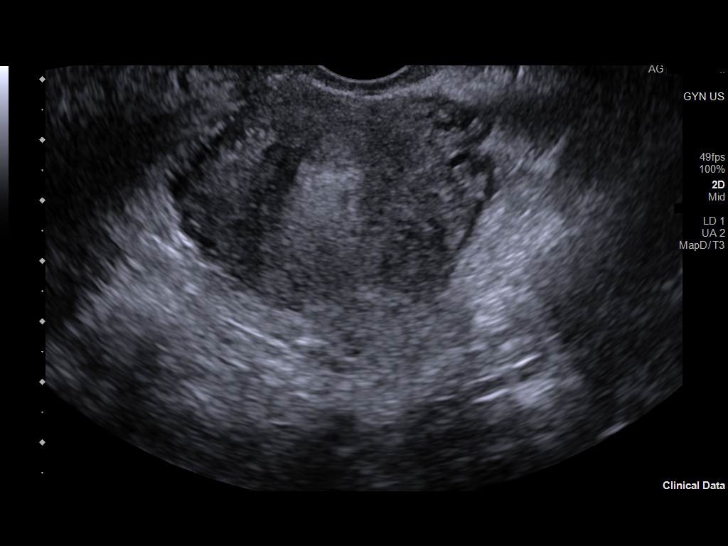
[im 78/117]
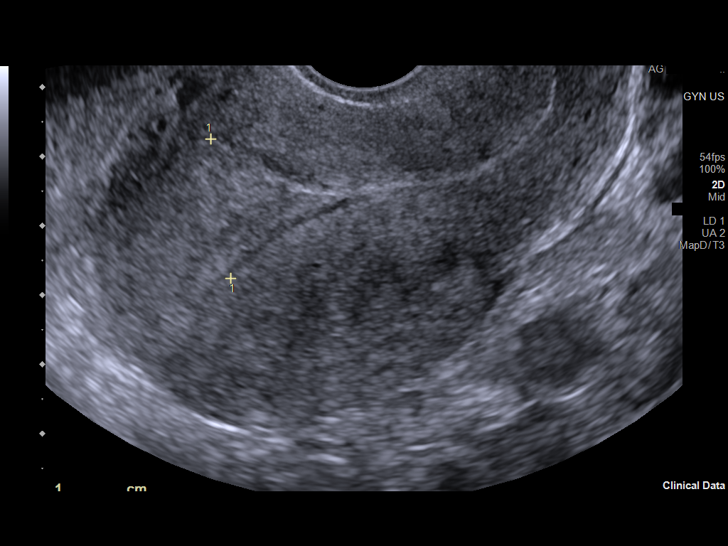
[im 86/117]
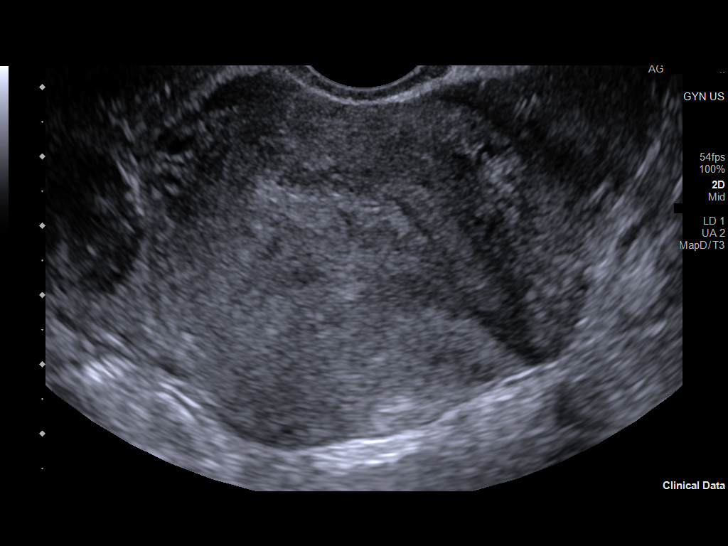
[im 95/117]
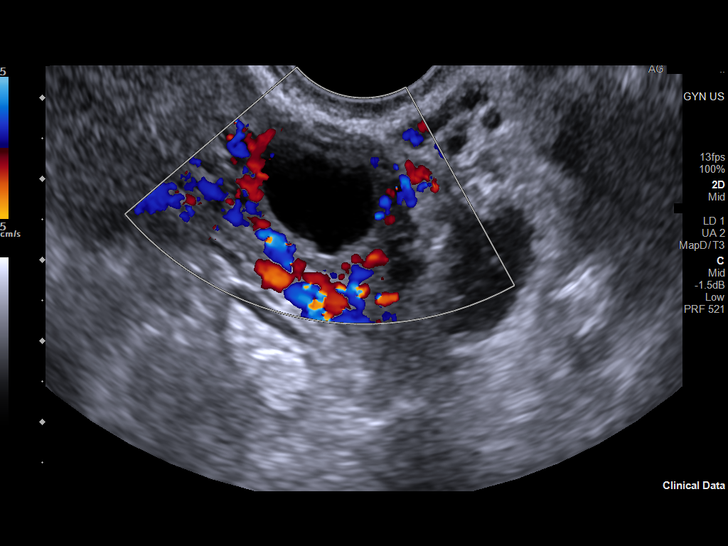
[im 104/117]
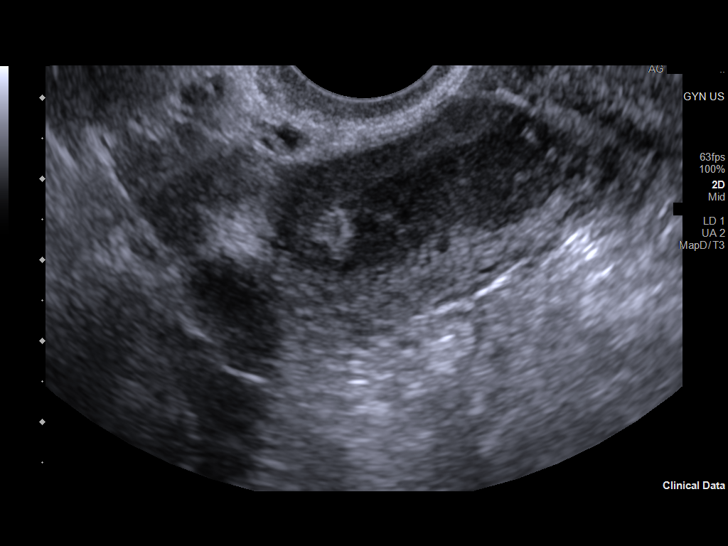
[im 112/117]
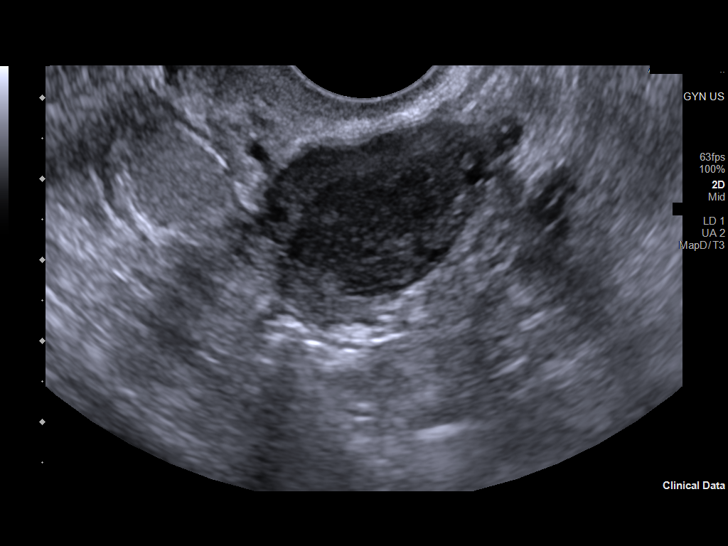

[13 of 28 positions shown; findings below may reference images not displayed]

FINDINGS: The uterus is anteverted and measures 9.4 x 5.2 x 6.9 cm for a
volume of 178 cc. The uterus demonstrates a heterogeneous
echotexture with findings suggestive of adenomyosis.

The endometrium is suboptimally visualized. The upper endometrium
appears to split and may have a bicornuate or an arcuate morphology.
MRI may provide better evaluation.

No intrauterine pregnancy identified.

There is a 6 x 7 x 5 mm echogenic lesion in the right ovary which
may represent a small fatty lesion with a focus of calcification,
possibly a teratoma.

There is a 1.6 cm corpus luteum cyst in the left ovary.

No free fluid within the pelvis.
IMPRESSION: 1. No intrauterine pregnancy identified and no suspicious adnexal
masses noted. Findings consistent with pregnancy of unknown location
and differential diagnosis includes: Recent spontaneous miscarriage,
an early IUP, or an occult ectopic pregnancy. Clinical correlation
and follow-up with serial HCG levels and repeat ultrasound, as
clinically indicated, recommended.
2. Small echogenic lesion in the right ovary may represent a
teratoma.

## 2021-12-07 ENCOUNTER — Emergency Department (HOSPITAL_COMMUNITY)
Admission: EM | Admit: 2021-12-07 | Discharge: 2021-12-07 | Disposition: A | Discharge status: Home / Self Care | Payer: Medicaid Other | Attending: Emergency Medicine | Admitting: Emergency Medicine

## 2021-12-07 ENCOUNTER — Encounter (HOSPITAL_COMMUNITY): Payer: Self-pay

## 2021-12-07 ENCOUNTER — Other Ambulatory Visit: Payer: Self-pay

## 2021-12-07 DIAGNOSIS — K029 Dental caries, unspecified: Secondary | ICD-10-CM | POA: Diagnosis not present

## 2021-12-07 DIAGNOSIS — K0889 Other specified disorders of teeth and supporting structures: Secondary | ICD-10-CM | POA: Diagnosis present

## 2021-12-07 MED ORDER — PENICILLIN V POTASSIUM 500 MG PO TABS: 500.0000 mg | ORAL_TABLET | Freq: Four times a day (QID) | ORAL | 0 refills | Status: AC

## 2021-12-07 MED ORDER — PENICILLIN V POTASSIUM 250 MG PO TABS
500.0000 mg | ORAL_TABLET | Freq: Once | ORAL | Status: AC
  Administered 2021-12-07: 500 mg via ORAL
  Filled 2021-12-07: qty 2

## 2021-12-07 MED ORDER — HYDROCODONE-ACETAMINOPHEN 5-325 MG PO TABS: 1.0000 | ORAL_TABLET | ORAL | 0 refills | Status: AC | PRN

## 2021-12-07 MED ORDER — HYDROCODONE-ACETAMINOPHEN 5-325 MG PO TABS
1.0000 | ORAL_TABLET | Freq: Once | ORAL | Status: AC
  Administered 2021-12-07: 1 via ORAL
  Filled 2021-12-07: qty 1

## 2021-12-07 NOTE — ED Provider Notes (Signed)
[facility] EMERGENCY DEPARTMENT Provider Note   CSN: [id number] Arrival date & time: 12/07/21  1712     History  Chief Complaint  Patient presents with   Dental Pain    Emily Merritt is a 36 y.o. female.  With no significant past medical history who presents to the emergency department with dental pain.  Patient states that symptoms have been ongoing for about 1 week.  She describes having left upper dental pain.  She is concerned that she may have an abscess.  She states that she has had some hot and cold sensitivity and has been chewing on the right side of her mouth.  She denies having any fevers, facial swelling, difficulty swallowing or breathing.  She states that she has been calling around to dentist who accept her Medicaid but nobody is taking new patients at this time.   Dental Pain Associated symptoms: no facial swelling and no fever        Home Medications Prior to Admission medications   Medication Sig Start Date End Date Taking? Authorizing Provider  HYDROcodone-acetaminophen (NORCO/VICODIN) 5-325 MG tablet Take 1 tablet by mouth every 4 (four) hours as needed for up to 3 days for severe pain. 12/07/21 12/10/21 Yes Cristopher Peru, PA-C  penicillin v potassium (VEETID) 500 MG tablet Take 1 tablet (500 mg total) by mouth 4 (four) times daily for 5 days. 12/07/21 12/12/21 Yes Cristopher Peru, PA-C  cephALEXin (KEFLEX) 500 MG capsule Take 1 capsule (500 mg total) by mouth 4 (four) times daily. 01/16/19   Ivery Quale, PA-C  dexamethasone (DECADRON) 4 MG tablet Take 1 tablet (4 mg total) by mouth 2 (two) times daily with a meal. 01/16/19   Ivery Quale, PA-C  fexofenadine (ALLEGRA) 60 MG tablet Take 1 tablet (60 mg total) by mouth 2 (two) times daily. 01/16/19   Ivery Quale, PA-C  ibuprofen (ADVIL,MOTRIN) 600 MG tablet Take 1 tablet (600 mg total) by mouth every 6 (six) hours as needed. 07/19/17   Law, Waylan Boga, PA-C  oxyCODONE-acetaminophen (PERCOCET/ROXICET) 5-325  MG tablet Take 1 tablet by mouth every 6 (six) hours as needed for severe pain. 08/29/20   Sponseller, Lupe Carney R, PA-C  Prenat w/o A-FeCbGl-DSS-FA-DHA (CITRANATAL ASSURE PO) Take 2 tablets by mouth daily.    [provider]      Allergies    Patient has no known allergies.    Review of Systems   Review of Systems  Constitutional:  Negative for fever.  HENT:  Positive for dental problem. Negative for facial swelling, trouble swallowing and voice change.   All other systems reviewed and are negative.   Physical Exam Updated Vital Signs BP 109/75 (BP Location: Right Arm)   Pulse (!) 52   Temp 98.3 F (36.8 C) (Oral)   Resp 16   Ht 5\' 9"  (1.753 m)   Wt 68 kg   SpO2 100%   BMI 22.15 kg/m  Physical Exam Vitals and nursing note reviewed.  HENT:     Head: Normocephalic and atraumatic.     Mouth/Throat:     Mouth: Mucous membranes are moist.     Dentition: Dental tenderness, gingival swelling and dental caries present.     Pharynx: Oropharynx is clear.      Comments: Dental caries The left upper molar has gingival swelling and erythema surrounding the tooth that has a dental carry.  There is no periapical abscess, tooth fracture, avulsion or bleeding socket.  No facial swelling.  Airway is clear.  Eyes:     General: No scleral icterus. Pulmonary:     Effort: Pulmonary effort is normal. No respiratory distress.  Musculoskeletal:     Cervical back: Normal range of motion and neck supple.  Lymphadenopathy:     Cervical: No cervical adenopathy.  Skin:    Findings: No rash.  Neurological:     General: No focal deficit present.     Mental Status: She is alert.  Psychiatric:        Mood and Affect: Mood normal.        Behavior: Behavior normal.        Thought Content: Thought content normal.        Judgment: Judgment normal.     ED Results / Procedures / Treatments   Labs (all labs ordered are listed, but only abnormal results are displayed) Labs Reviewed - No data  to display  EKG None  Radiology No results found.  Procedures Procedures    Medications Ordered in ED Medications  penicillin v potassium (VEETID) tablet 500 mg (500 mg Oral Given 12/07/21 2114)  HYDROcodone-acetaminophen (NORCO/VICODIN) 5-325 MG per tablet 1 tablet (1 tablet Oral Given 12/07/21 2114)    ED Course/ Medical Decision Making/ A&P                           Medical Decision Making Risk Prescription drug management.  This patient presents to the ED with chief complaint(s) of dental pain with pertinent past medical history of none which further complicates the presenting complaint. The complaint involves an extensive differential diagnosis and also carries with it a high risk of complications and morbidity.    The differential diagnosis includes tooth fracture, avulsion, bleeding socket, RPA, PTA, Ludwig's angina, periapical abscess, deep space infection, etc.  Additional history obtained: Additional history obtained from  none available Records reviewed Care Everywhere/External Records and Primary Care Documents most recent ED visit physician note for dental pain  ED Course and Reassessment: 36 year old female who presents to the emergency department for dental pain.  Dental pain is in the left upper molars.  She overall has very poor dentition.  Suspect that dental pain is due to dental caries.  She is not immunocompromise.  She is afebrile.  She is well-appearing and has a patent airway.  She does have some mild gingival swelling around the left upper molar but I have low suspicion for deep space infection at this time.  There is no evidence of tooth fracture, avulsion or bleeding socket.  No evidence of RPA, PTA, Ludwig's angina or periapical abscess.   Gave patient a dose of Norco and penicillin here in the emergency department.  We will also send her the same medications for prescription.  Also instructed continue use ibuprofen, warm compress on her face. Given  return precautions for significantly worsening pain, odontogenic infections or other dental pain emergencies.  I have provided her with a dental resource guide.  She states that she is currently on Medicaid and saw dentist who wanted to schedule her but her insurance did not approve for the procedure.  Hopefully one of the dental resource guide Maurine Minister is able to see her soon.  Independent labs interpretation:  The following labs were independently interpreted: not indicated  Independent visualization of imaging: - I independently visualized the following imaging with scope of interpretation limited to determining acute life threatening conditions related to emergency care: not indicated   Consultation: - Consulted or discussed management/test  interpretation w/ external professional: not indicated   Consideration for admission or further workup: not indicated  Social Determinants of health: no dentist - given dental resource guide  Final Clinical Impression(s) / ED Diagnoses Final diagnoses:  Pain due to dental caries    Rx / DC Orders ED Discharge Orders          Ordered    HYDROcodone-acetaminophen (NORCO/VICODIN) 5-325 MG tablet  Every 4 hours PRN        12/07/21 2050    penicillin v potassium (VEETID) 500 MG tablet  4 times daily        12/07/21 2050              Mickie Hillier, PA-C 12/08/21 1458    Milton Ferguson, MD 12/12/21 [phone]

## 2021-12-07 NOTE — Discharge Instructions (Signed)
You were seen in the emergency department today for dental pain.  I am prescribing you antibiotics that you will take 4 times a day over the next 5 days.  Additionally prescribed you a short course of opioid pain medication to help your symptoms.  You may also use ibuprofen.  You have been prescribed a medication that is considered an opiate. Opiates are pain medications that should be used with caution. It is important that you do not drive while taking this medication as it can cause drowsiness and impaired reaction times. Do not mix this medication with benzodiazepine medications or alcohol as this can cause respiratory depression. Additionally, opiates have addicting properties to them. Please use medication as prescribed by your provider.  We have attached a dental resource guide to your discharge paperwork for you to call some local dentistry's who may be able to see you.  Please return if you have significant facial swelling or difficulty breathing.

## 2021-12-07 NOTE — ED Triage Notes (Signed)
Patient c/o left lower jaw pain with swollen bump on gum line that has been bothering her for about a week. States the dentist she has called are not accepting new patients
